# Patient Record
Sex: Male | Born: 1990 | Hispanic: Yes | Marital: Single | Smoking: Never smoker
Health system: Southern US, Community
[De-identification: ages and names within clinical notes are randomized; demographics above are authoritative.]

## PROBLEM LIST (undated history)

## (undated) DIAGNOSIS — T7840XA Allergy, unspecified, initial encounter: Secondary | ICD-10-CM

## (undated) DIAGNOSIS — R011 Cardiac murmur, unspecified: Secondary | ICD-10-CM

## (undated) DIAGNOSIS — J45909 Unspecified asthma, uncomplicated: Secondary | ICD-10-CM

## (undated) DIAGNOSIS — E162 Hypoglycemia, unspecified: Secondary | ICD-10-CM

## (undated) HISTORY — DX: Cardiac murmur, unspecified: R01.1

## (undated) HISTORY — DX: Allergy, unspecified, initial encounter: T78.40XA

## (undated) HISTORY — DX: Hypoglycemia, unspecified: E16.2

## (undated) HISTORY — DX: Unspecified asthma, uncomplicated: J45.909

---
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
## 2016-02-18 DIAGNOSIS — R480 Dyslexia and alexia: Secondary | ICD-10-CM | POA: Diagnosis not present

## 2016-02-18 DIAGNOSIS — Z79899 Other long term (current) drug therapy: Secondary | ICD-10-CM | POA: Diagnosis not present

## 2016-02-18 DIAGNOSIS — F1920 Other psychoactive substance dependence, uncomplicated: Secondary | ICD-10-CM | POA: Diagnosis not present

## 2016-02-18 DIAGNOSIS — H93299 Other abnormal auditory perceptions, unspecified ear: Secondary | ICD-10-CM | POA: Diagnosis not present

## 2016-02-18 DIAGNOSIS — F902 Attention-deficit hyperactivity disorder, combined type: Secondary | ICD-10-CM | POA: Diagnosis not present

## 2016-02-21 DIAGNOSIS — H5213 Myopia, bilateral: Secondary | ICD-10-CM | POA: Diagnosis not present

## 2016-04-16 DIAGNOSIS — R002 Palpitations: Secondary | ICD-10-CM | POA: Diagnosis not present

## 2016-04-16 DIAGNOSIS — T887XXA Unspecified adverse effect of drug or medicament, initial encounter: Secondary | ICD-10-CM | POA: Diagnosis not present

## 2016-04-22 DIAGNOSIS — Z113 Encounter for screening for infections with a predominantly sexual mode of transmission: Secondary | ICD-10-CM | POA: Diagnosis not present

## 2016-04-22 DIAGNOSIS — Z0000 Encounter for general adult medical examination without abnormal findings: Secondary | ICD-10-CM | POA: Diagnosis not present

## 2016-06-26 DIAGNOSIS — F909 Attention-deficit hyperactivity disorder, unspecified type: Secondary | ICD-10-CM | POA: Diagnosis not present

## 2016-06-26 DIAGNOSIS — Z23 Encounter for immunization: Secondary | ICD-10-CM | POA: Diagnosis not present

## 2016-10-01 DIAGNOSIS — F909 Attention-deficit hyperactivity disorder, unspecified type: Secondary | ICD-10-CM | POA: Diagnosis not present

## 2016-12-30 DIAGNOSIS — R0981 Nasal congestion: Secondary | ICD-10-CM | POA: Diagnosis not present

## 2016-12-30 DIAGNOSIS — F909 Attention-deficit hyperactivity disorder, unspecified type: Secondary | ICD-10-CM | POA: Diagnosis not present

## 2017-06-08 DIAGNOSIS — R21 Rash and other nonspecific skin eruption: Secondary | ICD-10-CM | POA: Diagnosis not present

## 2017-07-21 DIAGNOSIS — Z23 Encounter for immunization: Secondary | ICD-10-CM | POA: Diagnosis not present

## 2017-07-21 DIAGNOSIS — F909 Attention-deficit hyperactivity disorder, unspecified type: Secondary | ICD-10-CM | POA: Diagnosis not present

## 2017-08-10 DIAGNOSIS — Z566 Other physical and mental strain related to work: Secondary | ICD-10-CM | POA: Diagnosis not present

## 2017-08-10 DIAGNOSIS — R0602 Shortness of breath: Secondary | ICD-10-CM | POA: Diagnosis not present

## 2017-09-06 DIAGNOSIS — F419 Anxiety disorder, unspecified: Secondary | ICD-10-CM | POA: Diagnosis not present

## 2017-09-06 DIAGNOSIS — Z0000 Encounter for general adult medical examination without abnormal findings: Secondary | ICD-10-CM | POA: Diagnosis not present

## 2017-09-23 ENCOUNTER — Encounter (INDEPENDENT_AMBULATORY_CARE_PROVIDER_SITE_OTHER): Payer: Self-pay

## 2017-09-23 ENCOUNTER — Other Ambulatory Visit: Payer: Self-pay

## 2017-09-23 ENCOUNTER — Encounter: Payer: Self-pay | Admitting: Student in an Organized Health Care Education/Training Program

## 2017-09-23 ENCOUNTER — Ambulatory Visit
Payer: 59 | Attending: Student in an Organized Health Care Education/Training Program | Admitting: Student in an Organized Health Care Education/Training Program

## 2017-09-23 VITALS — BP 124/72 | HR 61 | Temp 98.1°F | Resp 18 | Ht 67.0 in | Wt 165.0 lb

## 2017-09-23 DIAGNOSIS — H539 Unspecified visual disturbance: Secondary | ICD-10-CM

## 2017-09-23 DIAGNOSIS — Z8679 Personal history of other diseases of the circulatory system: Secondary | ICD-10-CM | POA: Diagnosis not present

## 2017-09-23 DIAGNOSIS — Q211 Atrial septal defect: Secondary | ICD-10-CM | POA: Diagnosis not present

## 2017-09-23 DIAGNOSIS — R002 Palpitations: Secondary | ICD-10-CM

## 2017-09-23 DIAGNOSIS — R071 Chest pain on breathing: Secondary | ICD-10-CM

## 2017-09-23 DIAGNOSIS — J321 Chronic frontal sinusitis: Secondary | ICD-10-CM

## 2017-09-23 DIAGNOSIS — Q2112 Patent foramen ovale: Secondary | ICD-10-CM

## 2017-09-23 DIAGNOSIS — R079 Chest pain, unspecified: Secondary | ICD-10-CM | POA: Diagnosis not present

## 2017-09-23 DIAGNOSIS — R0602 Shortness of breath: Secondary | ICD-10-CM | POA: Diagnosis not present

## 2017-09-23 DIAGNOSIS — F908 Attention-deficit hyperactivity disorder, other type: Secondary | ICD-10-CM

## 2017-09-23 DIAGNOSIS — G8929 Other chronic pain: Secondary | ICD-10-CM | POA: Insufficient documentation

## 2017-09-23 DIAGNOSIS — F988 Other specified behavioral and emotional disorders with onset usually occurring in childhood and adolescence: Secondary | ICD-10-CM | POA: Insufficient documentation

## 2017-09-23 NOTE — Patient Instructions (Addendum)
1.  Schedule for echocardiogram to rule out PFO/atrial septal defect 2.  CT maxillofacial without contrast to evaluate for chronic frontal sinusitis 3.  Labs including myoglobin, CK-MB, troponin, BNP, d-dimer to evaluate symptoms of shortness of breath to better stratify DVT risk. 4.  Follow-up as needed.  Patient states that he will sign up for epic my chart to check his results there.  If any questions or concerns patient will contact clinic for follow-up.

## 2017-09-23 NOTE — Progress Notes (Signed)
Patient's Name: Martin Durham  MRN: [id number]  Referring Provider: No ref. provider found  DOB: 11-02-1990  PCP: System, Pcp Not In  DOS: 09/23/2017  Note by: Edward Jolly, MD  Service setting: Ambulatory outpatient  Specialty: Interventional Pain Management  Location: [facility] (AMB) Pain Management Facility  Visit type: Initial Patient Evaluation  Patient type: New Patient   Primary Reason(s) for Visit: Encounter for initial evaluation of one or more chronic problems (new to examiner) potentially causing chronic pain, and posing a threat to normal musculoskeletal function. (Level of risk: High) CC: Chest Pain  HPI  Martin Durham is a 26 y.o. year old, male patient, who comes today to see [place] for the first time for an initial evaluation of his chronic pain. He has ADD (attention deficit disorder); Chest pain on breathing; Shortness of breath; Hx of cardiac murmur; PFO (patent foramen ovale); Chronic frontal sinusitis; Palpitations; and Visual disturbance on their problem list. Today he comes in for evaluation of his Chest Pain  Pain Assessment: Location: Anterior Chest Radiating: denies Onset: More than a month ago Duration: Acute pain(5 weeks ago it started) Quality: Aching Severity: 4 /10 (self-reported pain score)  Effect on ADL:   Timing: Intermittent Modifying factors: time for it to subside  Onset and Duration: Present less than 3 months Cause of pain: Unknown Severity: No change since onset Timing: Not influenced by the time of the day and During activity or exercise Aggravating Factors: na Alleviating Factors: na Associated Problems: Constipation, Dizziness and Fatigue Quality of Pain: Feeling of constriction Previous Examinations or Tests: The patient denies tests Previous Treatments: The patient denies treatments  The patient comes into the clinics today for the first time for a chronic pain management evaluation.   Very pleasant 26 year old gentleman who presents with  chief complaint of dizziness, palpitations accompanied with shortness of breath and visual disturbance that occurred 5-6 weeks ago.  This lasted for approximately 30 minutes.  The patient was at work.  At that time, he had shortness of breath, palpitations, diaphoresis, blurry vision, confusion which subsided after about 30 minutes. the following day the patient went to an urgent care in [place].  At that time he had fingerstick blood glucose performed along with an EKG which was unremarkable and showed normal sinus rhythm.  He also had a CBC with differential which was unremarkable at that time.  The patient's complete metabolic panel was also within normal limits.  Since this episode, the patient states he has had intermittent episodes of shortness of breath but not to the same extent as before.  He states that he was playing video games 2 nights ago and that he also has shortness of breath and that when he is exercising his shortness of breath is out of proportion to his level of exertion. Patient states that during his first episode, he was in a very stressful job which he has since left.  He will be starting a new job in about 3 weeks.  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  Patient had another routine exam performed in high school prior to starting football and was also noted to have a murmur at that time but the patient was asymptomatic.  Patient's past medical history includes psoriasis and ADD.  Patient also endorses chronic nasal congestion and takes Flonase for his allergic rhinitis.  Patient denies any headaches, ringing in his ears, flashing lights, nosebleeds, swelling of lips or tongue, cough, night  sweats, weight loss, diarrhea, changes in bowel habits, abdominal pain, difficulty urinating, seizures, loss of consciousness.  Meds   Current Outpatient Medications:  .  aspirin EC 81 MG tablet, Take 81 mg by mouth daily., Disp: , Rfl:  .  fluticasone  (FLONASE) 50 MCG/ACT nasal spray, Place into both nostrils daily., Disp: , Rfl:     ROS  Cardiovascular History: Daily Aspirin intake Pulmonary or Respiratory History: Shortness of breath and Snoring  Neurological History: No reported neurological signs or symptoms such as seizures, abnormal skin sensations, urinary and/or fecal incontinence, being born with an abnormal open spine and/or a tethered spinal cord Review of Past Neurological Studies: No results found for this or any previous visit. Psychological-Psychiatric History: No reported psychological or psychiatric signs or symptoms such as difficulty sleeping, anxiety, depression, delusions or hallucinations (schizophrenial), mood swings (bipolar disorders) or suicidal ideations or attempts Gastrointestinal History: Reflux or heatburn Genitourinary History: No reported renal or genitourinary signs or symptoms such as difficulty voiding or producing urine, peeing blood, non-functioning kidney, kidney stones, difficulty emptying the bladder, difficulty controlling the flow of urine, or chronic kidney disease Hematological History: No reported hematological signs or symptoms such as prolonged bleeding, low or poor functioning platelets, bruising or bleeding easily, hereditary bleeding problems, low energy levels due to low hemoglobin or being anemic Endocrine History: No reported endocrine signs or symptoms such as high or low blood sugar, rapid heart rate due to high thyroid levels, obesity or weight gain due to slow thyroid or thyroid disease Rheumatologic History: No reported rheumatological signs and symptoms such as fatigue, joint pain, tenderness, swelling, redness, heat, stiffness, decreased range of motion, with or without associated rash Musculoskeletal History: Negative for myasthenia gravis, muscular dystrophy, multiple sclerosis or malignant hyperthermia Work History: Working full time  Allergies  Martin Durham has No Known  Allergies.   Note: Lab results reviewed.  PFSH  Drug: Martin Durham  has no drug history on file. Alcohol:  has no alcohol history on file. Tobacco:  reports that  has never smoked. he has never used smokeless tobacco. Medical:  has a past medical history of Heart murmur. Family: family history includes Hyperlipidemia in his paternal grandfather and sister; Hypertension in his paternal grandfather and sister.  Past Surgical History:  Procedure Laterality Date  . WISDOM TOOTH EXTRACTION Bilateral 2012   Active Ambulatory Problems    Diagnosis Date Noted  . ADD (attention deficit disorder) 09/23/2017  . Chest pain on breathing 09/23/2017  . Shortness of breath 09/23/2017  . Hx of cardiac murmur 09/23/2017  . PFO (patent foramen ovale) 09/23/2017  . Chronic frontal sinusitis 09/23/2017  . Palpitations 09/23/2017  . Visual disturbance 09/23/2017   Resolved Ambulatory Problems    Diagnosis Date Noted  . No Resolved Ambulatory Problems   Past Medical History:  Diagnosis Date  . Heart murmur    Constitutional Exam  General appearance: Well nourished, well developed, and well hydrated. In no apparent acute distress Vitals:   09/23/17 1332  BP: 124/72  Pulse: 61  Resp: 18  Temp: 98.1 F (36.7 C)  TempSrc: Oral  SpO2: 100%  Weight: 165 lb (74.8 kg)  Height: 5\' 7"  (1.702 m)   BMI Assessment: Estimated body mass index is 25.84 kg/m as calculated from the following:   Height as of this encounter: 5\' 7"  (1.702 m).   Weight as of this encounter: 165 lb (74.8 kg).  BMI interpretation table: BMI level Category Range association with higher incidence of chronic  pain  <18 kg/m2 Underweight   18.5-24.9 kg/m2 Ideal body weight   25-29.9 kg/m2 Overweight Increased incidence by 20%  30-34.9 kg/m2 Obese (Class I) Increased incidence by 68%  35-39.9 kg/m2 Severe obesity (Class II) Increased incidence by 136%  >40 kg/m2 Extreme obesity (Class III) Increased incidence by 254%   BMI  Readings from Last 4 Encounters:  09/23/17 25.84 kg/m   Wt Readings from Last 4 Encounters:  09/23/17 165 lb (74.8 kg)  Psych/Mental status: Alert, oriented x 3 (person, place, & time)       Eyes: PERLA Respiratory: No evidence of acute respiratory distress Cardiac: No audible murmurs rubs or gallops appreciated.  No carotid bruits appreciated bilaterally.  Regular rate and rhythm.   Cervical Spine Area Exam  Skin & Axial Inspection: No masses, redness, edema, swelling, or associated skin lesions Alignment: Symmetrical Functional ROM: Unrestricted ROM      Stability: No instability detected Muscle Tone/Strength: Functionally intact. No obvious neuro-muscular anomalies detected. Sensory (Neurological): Unimpaired Palpation: No palpable anomalies              Upper Extremity (UE) Exam    Side: Right upper extremity  Side: Left upper extremity  Skin & Extremity Inspection: Skin color, temperature, and hair growth are WNL. No peripheral edema or cyanosis. No masses, redness, swelling, asymmetry, or associated skin lesions. No contractures.  Skin & Extremity Inspection: Skin color, temperature, and hair growth are WNL. No peripheral edema or cyanosis. No masses, redness, swelling, asymmetry, or associated skin lesions. No contractures.  Functional ROM: Unrestricted ROM          Functional ROM: Unrestricted ROM          Muscle Tone/Strength: Functionally intact. No obvious neuro-muscular anomalies detected.  Muscle Tone/Strength: Functionally intact. No obvious neuro-muscular anomalies detected.  Sensory (Neurological): Unimpaired          Sensory (Neurological): Unimpaired          Palpation: No palpable anomalies              Palpation: No palpable anomalies              Specialized Test(s): Deferred         Specialized Test(s): Deferred          Thoracic Spine Area Exam  Skin & Axial Inspection: No masses, redness, or swelling Alignment: Symmetrical Functional ROM: Unrestricted  ROM Stability: No instability detected Muscle Tone/Strength: Functionally intact. No obvious neuro-muscular anomalies detected. Sensory (Neurological): Unimpaired Muscle strength & Tone: No palpable anomalies  Lumbar Spine Area Exam  Skin & Axial Inspection: No masses, redness, or swelling Alignment: Symmetrical Functional ROM: Unrestricted ROM      Stability: No instability detected Muscle Tone/Strength: Functionally intact. No obvious neuro-muscular anomalies detected. Sensory (Neurological): Unimpaired Palpation: No palpable anomalies       Provocative Tests: Lumbar Hyperextension and rotation test: evaluation deferred today       Lumbar Lateral bending test: evaluation deferred today       Patrick's Maneuver: evaluation deferred today                    Gait & Posture Assessment  Ambulation: Unassisted Gait: Relatively normal for age and body habitus Posture: WNL   Lower Extremity Exam    Side: Right lower extremity  Side: Left lower extremity  Skin & Extremity Inspection: Skin color, temperature, and hair growth are WNL. No peripheral edema or cyanosis. No masses, redness, swelling, asymmetry, or associated skin  lesions. No contractures.  Skin & Extremity Inspection: Skin color, temperature, and hair growth are WNL. No peripheral edema or cyanosis. No masses, redness, swelling, asymmetry, or associated skin lesions. No contractures.  Functional ROM: Unrestricted ROM          Functional ROM: Unrestricted ROM          Muscle Tone/Strength: Functionally intact. No obvious neuro-muscular anomalies detected.  Muscle Tone/Strength: Functionally intact. No obvious neuro-muscular anomalies detected.  Sensory (Neurological): Unimpaired  Sensory (Neurological): Unimpaired  Palpation: No palpable anomalies  Palpation: No palpable anomalies   Assessment  Primary Diagnosis & Pertinent Problem List: The primary encounter diagnosis was Chest pain on breathing. Diagnoses of Shortness of  breath, Hx of cardiac murmur, PFO (patent foramen ovale), Chronic frontal sinusitis, Palpitations, Visual disturbance, and Attention deficit hyperactivity disorder (ADHD), other type were also pertinent to this visit.  Visit Diagnosis (New problems to examiner): 1. Chest pain on breathing   2. Shortness of breath   3. Hx of cardiac murmur   4. PFO (patent foramen ovale)   5. Chronic frontal sinusitis   6. Palpitations   7. Visual disturbance   8. Attention deficit hyperactivity disorder (ADHD), other type     26 year old male who presents with episodic shortness of breath, palpitations accompanied with diaphoresis, confusion, and vision changes which started approximately 5-6 weeks ago.  This was during a job stressor and the patient has quit that job and will be starting a new one approximately 3 weeks.  He still endorses shortness of breath and palpitations but not to the same extent as before.  Given that the patient had a PFO as a child with an audible murmur documented by physicians in the past but not currently, it be reasonable to obtain an echocardiogram to rule out PFO or atrial septal defect could be contributing to his chest pain, shortness of breath, palpitations.  The patient also has symptoms of chronic sinusitis and we will obtain a max of facial CT to evaluate his frontal sinuses.  Furthermore for risk stratification, will obtain myoglobin, CK-MB, troponin I, BNP and d-dimer to evaluate his risk of DVT given his symptoms of dyspnea and shortness of breath.  Patient states that he will check his results on my chart.  There is anything concerning, I will schedule the patient for follow-up.  At this point I would like to get the above mentioned studies and labs to rule out any life-threatening pathology.  In my opinion, most likely this is due to anxiety and panic attack secondary to job stressors but it is odd that the patient is continuing to have intermittent episodes of shortness of  breath and palpitations even when he is not at work and since he will be starting a new job.  Plan: -Echocardiogram to evaluate PFO/ASD -Maxofacial CT to evaluate sinusitis  -Myoglobin, CK-MB, troponin, BNP, d-dimer to evaluate DVT risk  Ordered Lab-work, Procedure(s), Referral(s), & Consult(s): Orders Placed This Encounter  Procedures  . CT MAXILLOFACIAL WO CONTRAST  . Myoglobin, serum  . CKMB([facility] only)  . Troponin I  . Brain natriuretic peptide  . D-dimer, quantitative  . ECHOCARDIOGRAM COMPLETE    Provider-requested follow-up: Return if symptoms worsen or fail to improve.  Future Appointments  Date Time Provider Department Center  10/01/2017  7:00 AM [facility]    Primary Care Physician: System, Pcp Not In Location: [facility] Note by: Edward Jolly, M.D, Date: 09/23/2017; Time: 3:03 PM  Patient Instructions  1.  Schedule for echocardiogram to rule out PFO/atrial septal defect 2.  CT maxillofacial without contrast to evaluate for chronic frontal sinusitis 3.  Labs including myoglobin, CK-MB, troponin, BNP, d-dimer to evaluate symptoms of shortness of breath to better stratify DVT risk. 4.  Follow-up as needed.  Patient states that he will sign up for epic my chart to check his results there.  If any questions or concerns patient will contact clinic for follow-up.

## 2017-09-23 NOTE — Progress Notes (Signed)
Safety precautions to be maintained throughout the outpatient stay will include: orient to surroundings, keep bed in low position, maintain call bell within reach at all times, provide assistance with transfer out of bed and ambulation.  

## 2017-09-24 LAB — D-DIMER, QUANTITATIVE (NOT AT ARMC)

## 2017-09-24 LAB — MYOGLOBIN, SERUM: MYOGLOBIN: 42 ng/mL (ref 28–72)

## 2017-10-01 ENCOUNTER — Ambulatory Visit
Admission: RE | Admit: 2017-10-01 | Discharge: 2017-10-01 | Disposition: A | Discharge status: Home / Self Care | Payer: 59 | Source: Ambulatory Visit | Attending: Student in an Organized Health Care Education/Training Program | Admitting: Student in an Organized Health Care Education/Training Program

## 2017-10-01 DIAGNOSIS — R011 Cardiac murmur, unspecified: Secondary | ICD-10-CM | POA: Insufficient documentation

## 2017-10-01 DIAGNOSIS — J321 Chronic frontal sinusitis: Secondary | ICD-10-CM | POA: Diagnosis not present

## 2017-10-01 DIAGNOSIS — Q211 Atrial septal defect: Secondary | ICD-10-CM | POA: Insufficient documentation

## 2017-10-01 DIAGNOSIS — J329 Chronic sinusitis, unspecified: Secondary | ICD-10-CM | POA: Diagnosis not present

## 2017-10-01 DIAGNOSIS — Q2112 Patent foramen ovale: Secondary | ICD-10-CM

## 2017-10-01 NOTE — Progress Notes (Signed)
*  PRELIMINARY RESULTS* Echocardiogram 2D Echocardiogram has been performed.  Cristela BlueHege, Shylah Dossantos 10/01/2017, 10:29 AM

## 2017-11-30 DIAGNOSIS — J069 Acute upper respiratory infection, unspecified: Secondary | ICD-10-CM | POA: Diagnosis not present

## 2017-11-30 DIAGNOSIS — J309 Allergic rhinitis, unspecified: Secondary | ICD-10-CM | POA: Insufficient documentation

## 2017-12-09 DIAGNOSIS — M950 Acquired deformity of nose: Secondary | ICD-10-CM | POA: Diagnosis not present

## 2017-12-09 DIAGNOSIS — J342 Deviated nasal septum: Secondary | ICD-10-CM | POA: Diagnosis not present

## 2017-12-09 DIAGNOSIS — J343 Hypertrophy of nasal turbinates: Secondary | ICD-10-CM | POA: Diagnosis not present

## 2018-12-18 IMAGING — CT CT MAXILLOFACIAL W/O CM
3 series · 15 of 47 positions shown, 18 images · non-contrast
Comparison: None

CLINICAL DATA: Continued sinus pressure and pain, no relief with
over the counter medication

EXAM:
CT MAXILLOFACIAL WITHOUT CONTRAST
TECHNIQUE: Multidetector CT imaging of the maxillofacial structures was
performed. Multiplanar CT image reconstructions were also generated.
Right side of face marked with BB.

[Series 3: ax soft · axial · 0.39mm/px · z∈[-140,-16]mm · 9 of 74 slices shown, 12 images]
[im 6/74  brain]
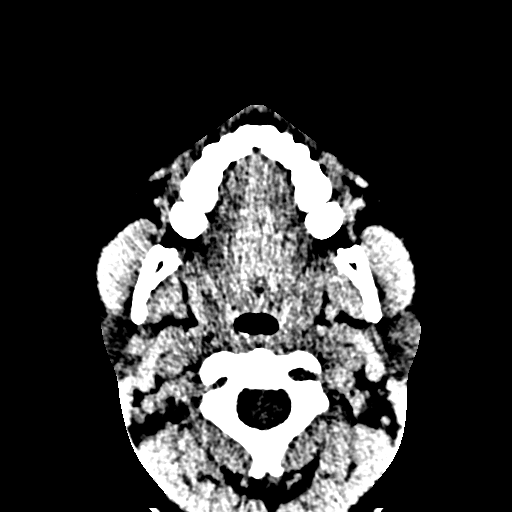
[im 6/74  bone]
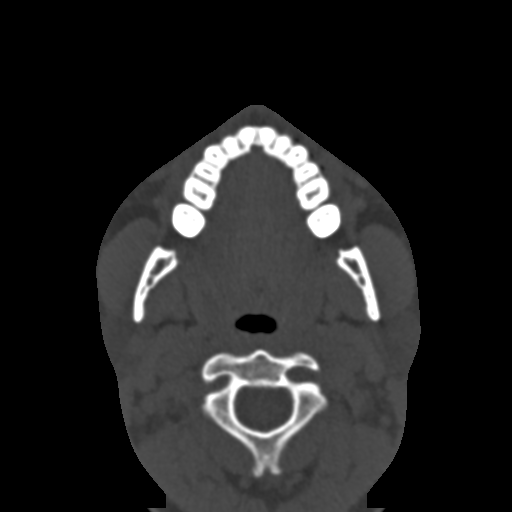
[im 13/74  bone]
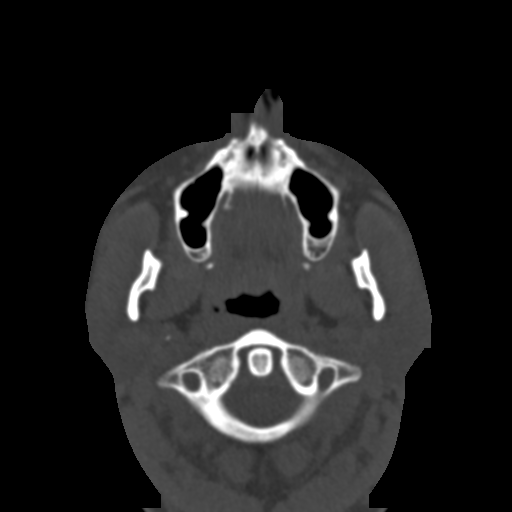
[im 21/74  bone]
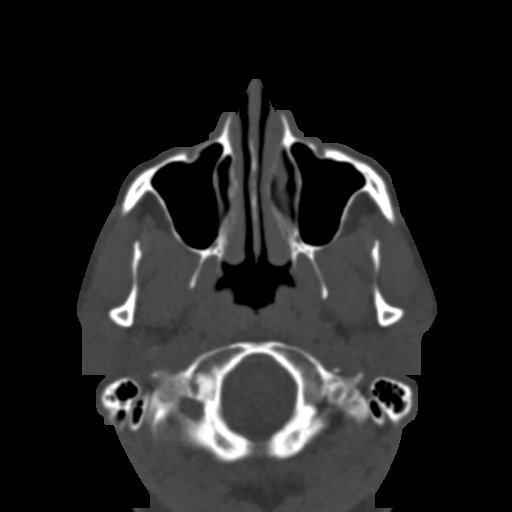
[im 28/74  bone]
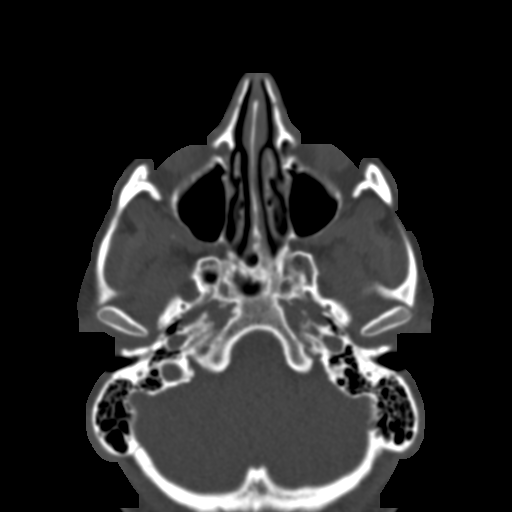
[im 38/74  brain]
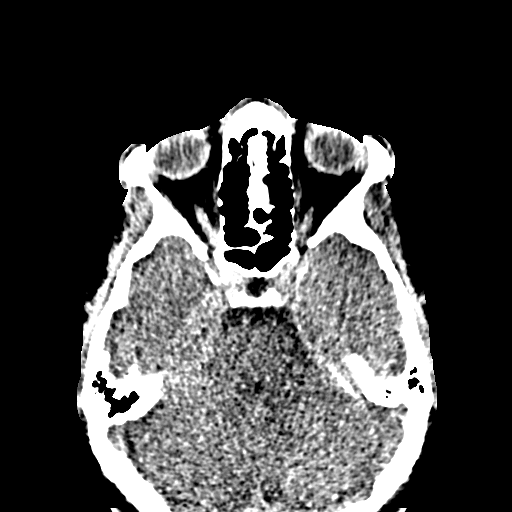
[im 38/74  bone]
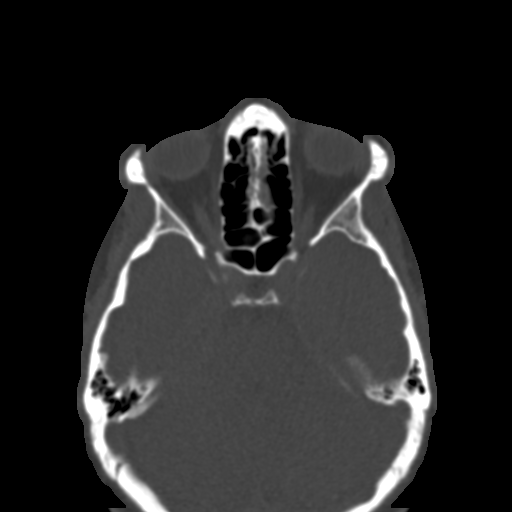
[im 46/74  bone]
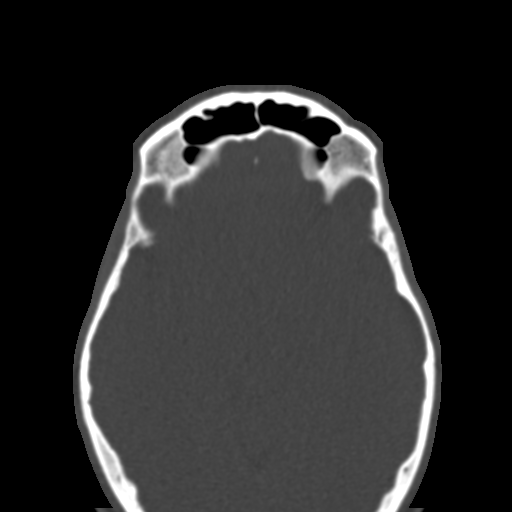
[im 53/74  bone]
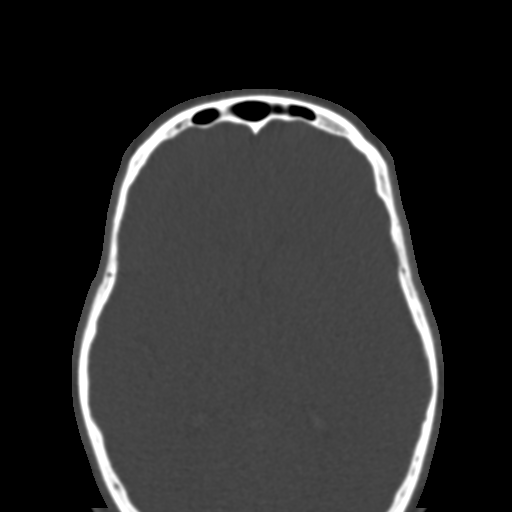
[im 61/74  bone]
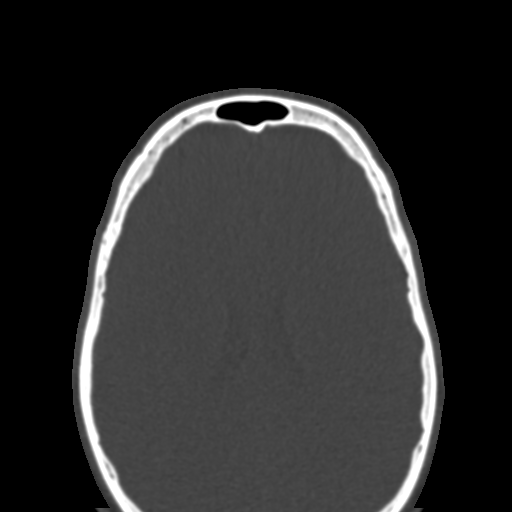
[im 68/74  brain]
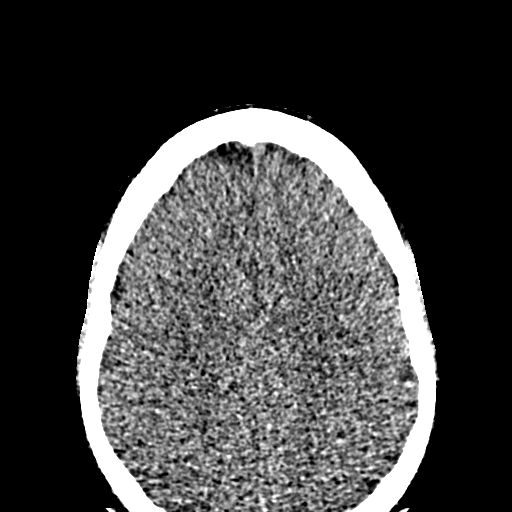
[im 68/74  bone]
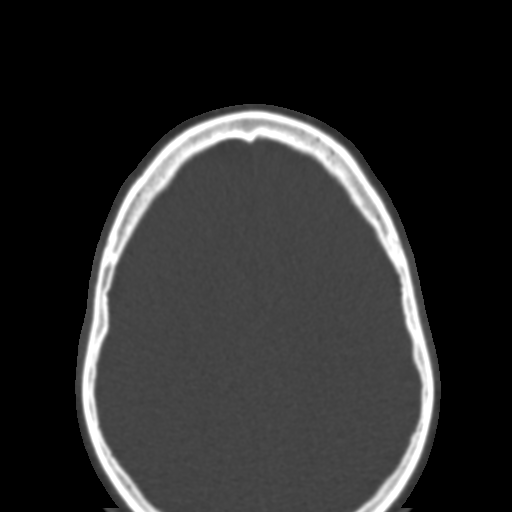

[Series 4: coronal bone · coronal · 0.32mm/px · 3 of 92 slices shown]
[im 31/92  bone]
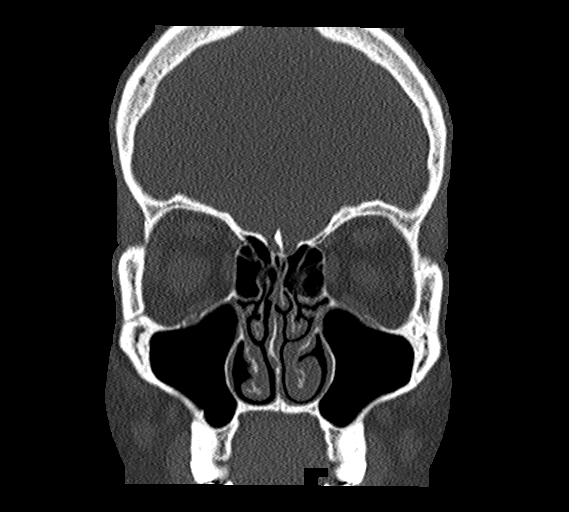
[im 41/92  bone]
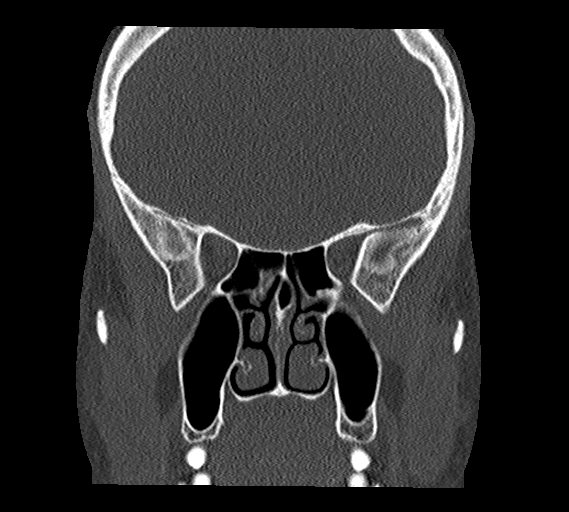
[im 51/92  bone]
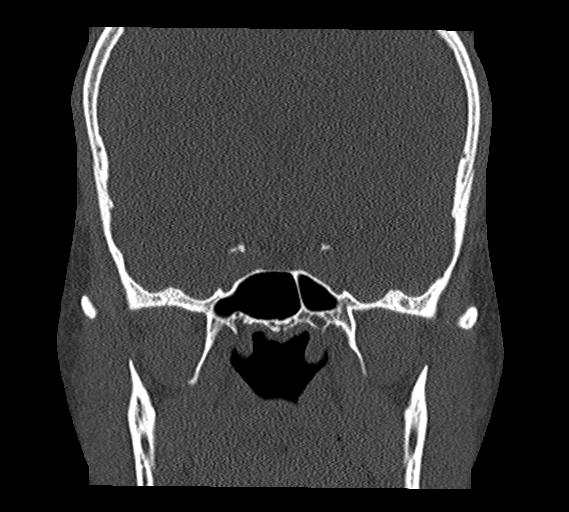

[Series 5: sagittal bone · sagittal · 0.33mm/px · 3 of 78 slices shown]
[im 26/78  bone]
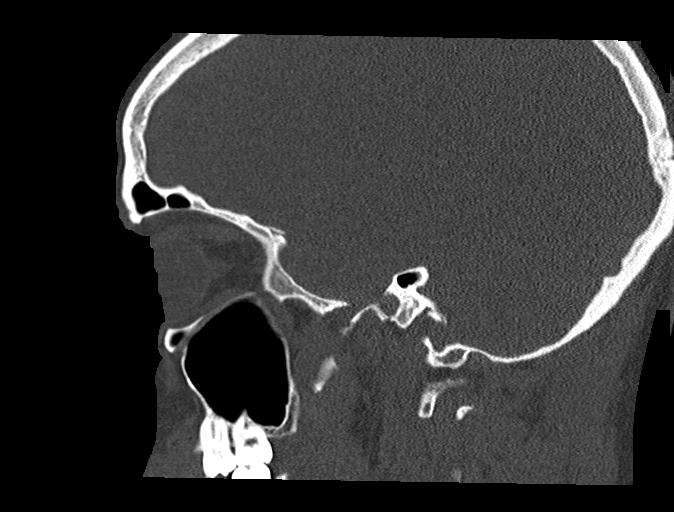
[im 39/78  bone]
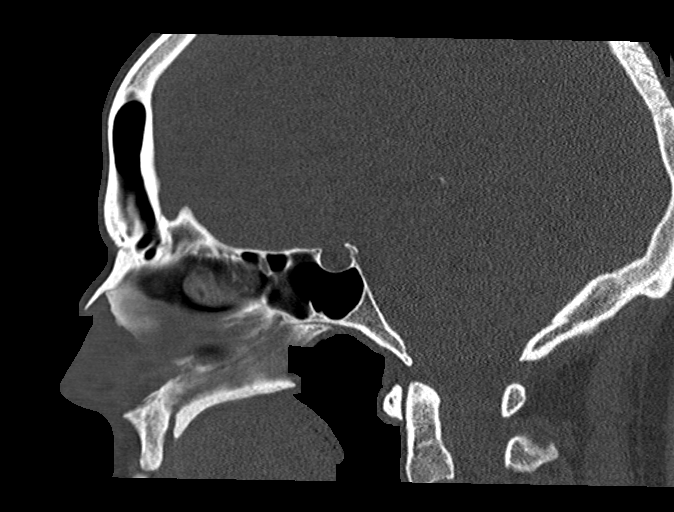
[im 52/78  bone]
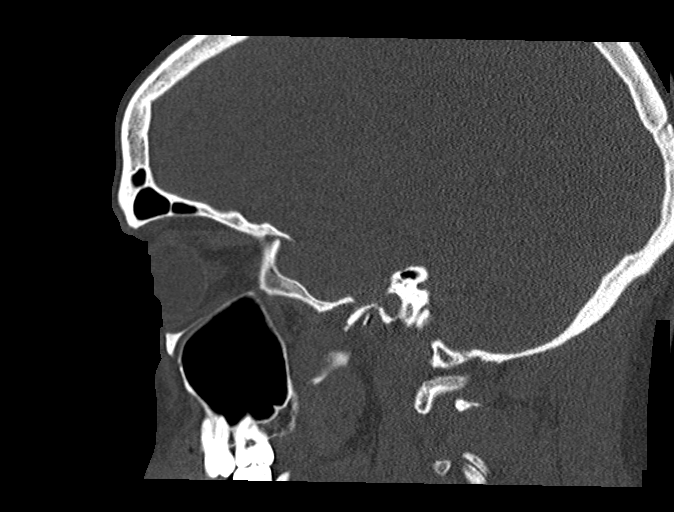

[15 of 47 positions shown; findings below may reference images not displayed]

FINDINGS: Osseous: TMJ alignment normal bilaterally. Visualize calvaria
intact. Facial bones intact. No fracture or bone destruction.

Orbits: Bony orbits intact.  Intraorbital soft tissue planes clear.

Sinuses: Paranasal sinuses, mastoid air cells, and middle ear
cavities clear.

Soft tissues: Facial soft tissues unremarkable

Limited intracranial: Normal appearance
IMPRESSION: Normal exam.

## 2020-06-09 ENCOUNTER — Other Ambulatory Visit: Payer: Self-pay | Admitting: Pain Medicine

## 2020-06-09 DIAGNOSIS — J45909 Unspecified asthma, uncomplicated: Secondary | ICD-10-CM | POA: Insufficient documentation

## 2020-06-09 DIAGNOSIS — J4540 Moderate persistent asthma, uncomplicated: Secondary | ICD-10-CM

## 2020-06-09 DIAGNOSIS — J45990 Exercise induced bronchospasm: Secondary | ICD-10-CM | POA: Insufficient documentation

## 2020-06-09 MED ORDER — ALBUTEROL SULFATE HFA 108 (90 BASE) MCG/ACT IN AERS: 1.0000 | INHALATION_SPRAY | RESPIRATORY_TRACT | 99 refills | Status: DC | PRN

## 2020-09-26 ENCOUNTER — Ambulatory Visit: Payer: 59 | Admitting: Family Medicine

## 2020-10-10 ENCOUNTER — Other Ambulatory Visit: Payer: Self-pay | Admitting: Pain Medicine

## 2020-10-10 DIAGNOSIS — Q674 Other congenital deformities of skull, face and jaw: Secondary | ICD-10-CM

## 2020-10-10 DIAGNOSIS — R002 Palpitations: Secondary | ICD-10-CM

## 2020-10-10 DIAGNOSIS — Z8709 Personal history of other diseases of the respiratory system: Secondary | ICD-10-CM

## 2020-10-10 DIAGNOSIS — R0602 Shortness of breath: Secondary | ICD-10-CM

## 2020-10-10 DIAGNOSIS — R42 Dizziness and giddiness: Secondary | ICD-10-CM | POA: Insufficient documentation

## 2020-10-10 DIAGNOSIS — Q2112 Patent foramen ovale: Secondary | ICD-10-CM

## 2020-10-10 DIAGNOSIS — Q211 Atrial septal defect: Secondary | ICD-10-CM

## 2020-10-10 DIAGNOSIS — R000 Tachycardia, unspecified: Secondary | ICD-10-CM

## 2020-10-10 DIAGNOSIS — Z8679 Personal history of other diseases of the circulatory system: Secondary | ICD-10-CM

## 2020-10-10 NOTE — Progress Notes (Signed)
30 year old Hispanic male born with a patent foramen ovale, currently experiencing intermittent episodes of shortness of breath, tachycardia, hypertension, and lightheadedness.  The patient has been referred to cardiology for evaluation and treatment.  In addition the patient has a history of a deviated septum with occasional difficult nasal breathing, currently being also referred to ENT for evaluation and treatment.

## 2020-10-18 ENCOUNTER — Other Ambulatory Visit: Payer: Self-pay | Admitting: Pain Medicine

## 2020-10-18 DIAGNOSIS — J309 Allergic rhinitis, unspecified: Secondary | ICD-10-CM | POA: Insufficient documentation

## 2020-10-18 DIAGNOSIS — L259 Unspecified contact dermatitis, unspecified cause: Secondary | ICD-10-CM | POA: Insufficient documentation

## 2020-10-18 DIAGNOSIS — J3089 Other allergic rhinitis: Secondary | ICD-10-CM | POA: Insufficient documentation

## 2020-10-18 DIAGNOSIS — L309 Dermatitis, unspecified: Secondary | ICD-10-CM | POA: Insufficient documentation

## 2020-10-18 DIAGNOSIS — F419 Anxiety disorder, unspecified: Secondary | ICD-10-CM | POA: Insufficient documentation

## 2020-10-18 DIAGNOSIS — Z872 Personal history of diseases of the skin and subcutaneous tissue: Secondary | ICD-10-CM | POA: Insufficient documentation

## 2020-10-18 MED ORDER — AZELASTINE HCL 137 MCG/SPRAY NA SOLN: 1.0000 | Freq: Every day | NASAL | 99 refills | Status: DC

## 2020-10-22 ENCOUNTER — Encounter (INDEPENDENT_AMBULATORY_CARE_PROVIDER_SITE_OTHER): Payer: Self-pay | Admitting: Otolaryngology

## 2020-10-22 ENCOUNTER — Other Ambulatory Visit: Payer: Self-pay | Admitting: Pain Medicine

## 2020-10-22 ENCOUNTER — Other Ambulatory Visit: Payer: Self-pay

## 2020-10-22 ENCOUNTER — Ambulatory Visit (INDEPENDENT_AMBULATORY_CARE_PROVIDER_SITE_OTHER): Payer: 59 | Admitting: Otolaryngology

## 2020-10-22 VITALS — Temp 97.3°F

## 2020-10-22 DIAGNOSIS — E162 Hypoglycemia, unspecified: Secondary | ICD-10-CM | POA: Insufficient documentation

## 2020-10-22 DIAGNOSIS — J342 Deviated nasal septum: Secondary | ICD-10-CM

## 2020-10-22 DIAGNOSIS — R531 Weakness: Secondary | ICD-10-CM | POA: Insufficient documentation

## 2020-10-22 DIAGNOSIS — J310 Chronic rhinitis: Secondary | ICD-10-CM | POA: Diagnosis not present

## 2020-10-22 DIAGNOSIS — I471 Supraventricular tachycardia: Secondary | ICD-10-CM | POA: Insufficient documentation

## 2020-10-22 DIAGNOSIS — L509 Urticaria, unspecified: Secondary | ICD-10-CM | POA: Insufficient documentation

## 2020-10-22 DIAGNOSIS — H538 Other visual disturbances: Secondary | ICD-10-CM | POA: Insufficient documentation

## 2020-10-22 DIAGNOSIS — R0689 Other abnormalities of breathing: Secondary | ICD-10-CM | POA: Insufficient documentation

## 2020-10-22 DIAGNOSIS — R410 Disorientation, unspecified: Secondary | ICD-10-CM | POA: Insufficient documentation

## 2020-10-22 DIAGNOSIS — R062 Wheezing: Secondary | ICD-10-CM | POA: Insufficient documentation

## 2020-10-22 DIAGNOSIS — R42 Dizziness and giddiness: Secondary | ICD-10-CM | POA: Insufficient documentation

## 2020-10-22 DIAGNOSIS — R519 Headache, unspecified: Secondary | ICD-10-CM | POA: Insufficient documentation

## 2020-10-22 DIAGNOSIS — R4189 Other symptoms and signs involving cognitive functions and awareness: Secondary | ICD-10-CM | POA: Insufficient documentation

## 2020-10-22 DIAGNOSIS — R21 Rash and other nonspecific skin eruption: Secondary | ICD-10-CM | POA: Insufficient documentation

## 2020-10-22 NOTE — Progress Notes (Signed)
HPI: Martin Durham is a 30 y.o. male who presents for evaluation of nasal obstruction.  Over the past couple years he notices that he does not breathe well through both nostrils.  The left side is generally worse but the obstruction alternates from side to side.  He has used Flonase in the past but this did not seem to help much.  More recently was prescribed azelastine by an allergist which seemed to help a little bit.  He is actually breathing better today. He does have history of allergies and has used Singulair and Claritin in the past.  Past Medical History:  Diagnosis Date  . Heart murmur    Past Surgical History:  Procedure Laterality Date  . WISDOM TOOTH EXTRACTION Bilateral 2012   Social History   Socioeconomic History  . Marital status: Single    Spouse name: Not on file  . Number of children: Not on file  . Years of education: Not on file  . Highest education level: Not on file  Occupational History  . Not on file  Tobacco Use  . Smoking status: Never Smoker  . Smokeless tobacco: Never Used  Substance and Sexual Activity  . Alcohol use: Not on file  . Drug use: Not on file  . Sexual activity: Not on file  Other Topics Concern  . Not on file  Social History Narrative  . Not on file   Social Determinants of Health   Financial Resource Strain: Not on file  Food Insecurity: Not on file  Transportation Needs: Not on file  Physical Activity: Not on file  Stress: Not on file  Social Connections: Not on file   Family History  Problem Relation Age of Onset  . Hypertension Sister   . Hyperlipidemia Sister   . Hyperlipidemia Paternal Grandfather   . Hypertension Paternal Grandfather    Allergies  Allergen Reactions  . Dust Mite Extract Other (See Comments)    Rhinitis   Prior to Admission medications   Medication Sig Start Date End Date Taking? Authorizing Provider  albuterol (VENTOLIN HFA) 108 (90 Base) MCG/ACT inhaler Inhale 1-2 puffs into the lungs  every 4 (four) hours as needed for wheezing or shortness of breath. 06/09/20 12/06/20  Delano Metz, MD  albuterol (VENTOLIN HFA) 108 (90 Base) MCG/ACT inhaler Inhale 2 puffs into the lungs every 6 (six) hours as needed for wheezing.    [provider]  aspirin EC 81 MG tablet Take 81 mg by mouth daily.    [provider]  Azelastine HCl 137 MCG/SPRAY SOLN Place 1 spray into the nose daily. 10/18/20   Delano Metz, MD  fluticasone (FLONASE) 50 MCG/ACT nasal spray Place 1 spray into the nose daily.    [provider]  fluticasone (FLONASE) 50 MCG/ACT nasal spray Place 1 spray into the nose daily.    [provider]  fluticasone furoate-vilanterol (BREO ELLIPTA) 200-25 MCG/INH AEPB Take 1 puff by mouth daily.    [provider]  loratadine (CLARITIN) 10 MG tablet Take 10 mg by mouth daily.    [provider]  montelukast (SINGULAIR) 10 MG tablet Take 1 tablet by mouth daily.    [provider]  oseltamivir (TAMIFLU) 75 MG capsule Take 1 capsule by mouth 2 (two) times daily. X 5 days    [provider]     Positive ROS: Otherwise negative  All other systems have been reviewed and were otherwise negative with the exception of those mentioned in the  HPI and as above.  Physical Exam: Constitutional: Alert, well-appearing, no acute distress Ears: External ears without lesions or tenderness. Ear canals are clear bilaterally with intact, clear TMs.  Nasal: External nose without lesions. Septum is deviated to the left but not obstructing the nasal cavity.  He has no significant swelling of the inferior turbinates presently.  After decongesting the nose there were no polyps or intranasal masses noted.  Both middle meatus regions were clear..  Oral: Lips and gums without lesions. Tongue and palate mucosa without lesions. Posterior oropharynx clear. Neck: No palpable adenopathy or masses Respiratory: Breathing comfortably   Skin: No facial/neck lesions or rash noted.  Procedures  Assessment: Allergic rhinitis. 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  Plan: Discussed with the patient concerning use of nasal steroid spray either Flonase or Nasacort 2 sprays each nostril at night.  He can also use the azelastine in addition to the nasal steroid spray. If he continues to have chronic problems with nasal obstruction could consider surgical intervention and briefly discussed with him concerning septoplasty and turbinate reductions which would help in large both nasal cavities.  Narda Bonds, MD

## 2020-10-23 NOTE — Progress Notes (Signed)
New Outpatient Visit Date: 10/24/2020  Primary Care Provider: None  Chief Complaint: Papitations and lightheadedness  HPI:  Mr. Martin Durham is a 30 y.o. male who is being seen today for the evaluation of palpitations. He has a history of heart murmur as a child, asthma, and environmental allergies.  For the last several years, he has experienced intermittent palpitations.  However, the symptoms have progressed over the last year for unclear reasons.  At times, Mr. Martin Durham experiences skipped beats.  However, at other times it feels like his heart races for no reason.  Episodes can last from a few seconds to 30 minutes.  Accompanying symptoms include shortness of breath, lightheadedness, and tingling in his extremities.  Episodes are often precipitated by exercise, eating, and feeling hungry.  Mr. Martin Durham has never passed out.  Ehocardiogram was performed in 2018 for evaluation of heart murmur and possible PFO; no significant abnormality was identified though bubble study was not performed as part of the test.  Mr. Martin Durham reports elevated heart rates in the past, which prompted him to stop taking Vyvanse ~3 years ago.  He also cut out caffeine consumption at that time.  He is concerned that low blood sugars may be contributing to his symptoms, as he has been checking them when he is symptomatic and notes readings as low as the 50's (he does not have a history of diabetes nor does he take glucose-lowering agents).  Following our encounter, shortly after having had labs drawn and being prepped for event monitor placement, Mr. Martin Durham suddenly became lightheaded and diaphoretic.  He also felt like his heart was beating faster/harder than normal.  He was assisted to the ground with resolution of his symptoms over the course of several minutes.  HF and blood pressure remained normal.  EKG was stable.Marland Kitchen  He did not pass out.  He had never had an episode like  this.  --------------------------------------------------------------------------------------------------  Cardiovascular History & Procedures: Cardiovascular Problems:  Palpitations  Near-syncope  Risk Factors:  Male gender  Cath/PCI:  None  CV Surgery:  None  EP Procedures and Devices:  None  Non-Invasive Evaluation(s):  TEE (10/01/2017): Normal LV size and wall thickness.  LVEF 55-60% with normal diastolic function.  Normal RV size and function.  No significant valvular abnormality.  No definite evidence of PFO by color Doppler.  Bubble study was not performed.  --------------------------------------------------------------------------------------------------  Past Medical History:  Diagnosis Date   Allergies    Asthma    Heart murmur     Past Surgical History:  Procedure Laterality Date   WISDOM TOOTH EXTRACTION Bilateral 2012    Current Meds  Medication Sig   albuterol (VENTOLIN HFA) 108 (90 Base) MCG/ACT inhaler Inhale 1-2 puffs into the lungs every 4 (four) hours as needed for wheezing or shortness of breath.   Azelastine HCl 137 MCG/SPRAY SOLN Place 1 spray into the nose daily.   fluticasone (FLONASE) 50 MCG/ACT nasal spray Place 1 spray into the nose daily.   [DISCONTINUED] albuterol (VENTOLIN HFA) 108 (90 Base) MCG/ACT inhaler Inhale 2 puffs into the lungs every 6 (six) hours as needed for wheezing.    Allergies: Dust mite extract  Social History   Tobacco Use   Smoking status: Never Smoker   Smokeless tobacco: Never Used  Substance Use Topics   Alcohol use: Not Currently   Drug use: Not Currently    Types: Marijuana    Comment: Last used 1-2 months ago    Family History  Problem Relation  Age of Onset   Diabetes Father    Hyperlipidemia Paternal Grandfather    Hypertension Paternal Grandfather    Heart disease Neg Hx     Review of Systems: A 12-system review of systems was performed and was negative except as noted  in the HPI.  --------------------------------------------------------------------------------------------------  Physical Exam: BP 120/88 (BP Location: Right Arm, Patient Position: Sitting, Cuff Size: Normal)    Pulse 86    Ht 5\' 8"  (1.727 m)    Wt 212 lb (96.2 kg)    SpO2 98%    BMI 32.23 kg/m   General:  NAD. HEENT: No conjunctival pallor or scleral icterus. Facemask in place. Neck: Supple without lymphadenopathy, thyromegaly, JVD, or HJR. No carotid bruit. Lungs: Normal work of breathing. Clear to auscultation bilaterally without wheezes or crackles. Heart: Regular rate and rhythm without murmurs, rubs, or gallops. Non-displaced PMI. Abd: Bowel sounds present. Soft, NT/ND without hepatosplenomegaly Ext: No lower extremity edema. Radial, PT, and DP pulses are 2+ bilaterally Skin: Warm and dry without rash. Neuro: CNIII-XII intact. Strength and fine-touch sensation intact in upper and lower extremities bilaterally. Psych: Normal mood and affect.  EKG:  Normal sinus rhythm with borderline LVH.  --------------------------------------------------------------------------------------------------  ASSESSMENT AND PLAN: Near-syncope, shortness of breath, and palpitations: Mr. reports palpitations for years, though his constellation of symptoms has worsened over the last year.  Exercise-induced component with associated lightheadedness is somewhat concerning.  Echo in 2018 was unremarkable.  EKG's today show borderline LVH but otherwise no significant abnormalities.  Near-syncopal episode today was most likely vasovagal in nature.  We have agreed to check a CBC, CMP, TSH, and hemoglobin A1c (given report of intermittent hypoglycemia).  We will also place a 14-day event monitor.  Based on results, we will need to consider exercise tolerance test to exclude exercise-induced arrhythmia.  We will defer repeating echo at this time, given unremarkable study in 2018.  Follow-up: Return to clinic  in 4-6 weeks.  2019, MD 10/24/2020 10:00 PM

## 2020-10-24 ENCOUNTER — Encounter: Payer: Self-pay | Admitting: Internal Medicine

## 2020-10-24 ENCOUNTER — Ambulatory Visit (INDEPENDENT_AMBULATORY_CARE_PROVIDER_SITE_OTHER): Payer: 59 | Admitting: Internal Medicine

## 2020-10-24 ENCOUNTER — Other Ambulatory Visit: Payer: Self-pay

## 2020-10-24 ENCOUNTER — Ambulatory Visit (INDEPENDENT_AMBULATORY_CARE_PROVIDER_SITE_OTHER): Payer: 59

## 2020-10-24 VITALS — BP 120/88 | HR 86 | Ht 68.0 in | Wt 212.0 lb

## 2020-10-24 DIAGNOSIS — R0602 Shortness of breath: Secondary | ICD-10-CM

## 2020-10-24 DIAGNOSIS — R002 Palpitations: Secondary | ICD-10-CM | POA: Diagnosis not present

## 2020-10-24 DIAGNOSIS — R55 Syncope and collapse: Secondary | ICD-10-CM | POA: Diagnosis not present

## 2020-10-24 DIAGNOSIS — R42 Dizziness and giddiness: Secondary | ICD-10-CM | POA: Diagnosis not present

## 2020-10-24 NOTE — Patient Instructions (Signed)
Medication Instructions:  Your physician recommends that you continue on your current medications as directed. Please refer to the Current Medication list given to you today.  *If you need a refill on your cardiac medications before your next appointment, please call your pharmacy*  Lab Work: Your physician recommends that you return for lab work in: TODAY - CBC, CMP, TSH, HGB A1C.  If you have labs (blood work) drawn today and your tests are completely normal, you will receive your results only by: Marland Kitchen MyChart Message (if you have MyChart) OR . A paper copy in the mail If you have any lab test that is abnormal or we need to change your treatment, we will call you to review the results.  Testing/Procedures:  ZIO XT MONITOR 14 DAYS Your physician has recommended that you wear a Zio monitor. This monitor is a medical device that records the heart's electrical activity. Doctors most often use these monitors to diagnose arrhythmias. Arrhythmias are problems with the speed or rhythm of the heartbeat. The monitor is a small device applied to your chest. You can wear one while you do your normal daily activities. While wearing this monitor if you have any symptoms to push the button and record what you felt. Once you have worn this monitor for the period of time provider prescribed (Usually 14 days), you will return the monitor device in the postage paid box. Once it is returned they will download the data collected and provide [place] with a report which the provider will then review and we will call you with those results. Important tips:  1. Avoid showering during the first 24 hours of wearing the monitor. 2. Avoid excessive sweating to help maximize wear time. 3. Do not submerge the device, no hot tubs, and no swimming pools. 4. Keep any lotions or oils away from the patch. 5. After 24 hours you may shower with the patch on. Take brief showers with your back facing the shower head.  6. Do not remove  patch once it has been placed because that will interrupt data and decrease adhesive wear time. 7. Push the button when you have any symptoms and write down what you were feeling. 8. Once you have completed wearing your monitor, remove and place into box which has postage paid and place in your outgoing mailbox.  9. If for some reason you have misplaced your box then call our office and we can provide another box and/or mail it off for you.   Follow-Up: At [facility], you and your health needs are our priority.  As part of our continuing mission to provide you with exceptional heart care, we have created designated Provider Care Teams.  These Care Teams include your primary Cardiologist (physician) and Advanced Practice Providers (APPs -  Physician Assistants and Nurse Practitioners) who all work together to provide you with the care you need, when you need it.  We recommend signing up for the patient portal called "MyChart".  Sign up information is provided on this After Visit Summary.  MyChart is used to connect with patients for Virtual Visits (Telemedicine).  Patients are able to view lab/test results, encounter notes, upcoming appointments, etc.  Non-urgent messages can be sent to your provider as well.   To learn more about what you can do with MyChart, go to [email or web address].    Your next appointment:   4-6 week(s)  The format for your next appointment:   In Person  Provider:   You may see  DR Cristal Deer END or one of the following Advanced Practice Providers on your designated Care Team:    Nicolasa Ducking, NP  Eula Listen, PA-C  Marisue Ivan, PA-C  Cadence Essex, [place]  Gillian Shields, NP

## 2020-10-25 ENCOUNTER — Telehealth: Payer: Self-pay | Admitting: *Deleted

## 2020-10-25 LAB — HEMOGLOBIN A1C
Est. average glucose Bld gHb Est-mCnc: 100 mg/dL
Hgb A1c MFr Bld: 5.1 % (ref 4.8–5.6)

## 2020-10-25 LAB — CBC
Hematocrit: 47.9 % (ref 37.5–51.0)
Hemoglobin: 16 g/dL (ref 13.0–17.7)
MCH: 28.5 pg (ref 26.6–33.0)
MCHC: 33.4 g/dL (ref 31.5–35.7)
MCV: 85 fL (ref 79–97)
Platelets: 271 10*3/uL (ref 150–450)
RBC: 5.62 x10E6/uL (ref 4.14–5.80)
RDW: 12.8 % (ref 11.6–15.4)
WBC: 6.5 10*3/uL (ref 3.4–10.8)

## 2020-10-25 LAB — COMPREHENSIVE METABOLIC PANEL
ALT: 25 IU/L (ref 0–44)
AST: 21 IU/L (ref 0–40)
Albumin/Globulin Ratio: 1.4 (ref 1.2–2.2)
Albumin: 4.2 g/dL (ref 4.1–5.2)
Alkaline Phosphatase: 63 IU/L (ref 44–121)
BUN/Creatinine Ratio: 15 (ref 9–20)
BUN: 14 mg/dL (ref 6–20)
Bilirubin Total: 0.3 mg/dL (ref 0.0–1.2)
CO2: 24 mmol/L (ref 20–29)
Calcium: 9.4 mg/dL (ref 8.7–10.2)
Chloride: 104 mmol/L (ref 96–106)
Creatinine, Ser: 0.93 mg/dL (ref 0.76–1.27)
GFR calc Af Amer: 129 mL/min/{1.73_m2} (ref >59)
GFR calc non Af Amer: 111 mL/min/{1.73_m2} (ref >59)
Globulin, Total: 3.1 g/dL (ref 1.5–4.5)
Glucose: 100 mg/dL — ABNORMAL HIGH (ref 65–99)
Potassium: 4.2 mmol/L (ref 3.5–5.2)
Sodium: 139 mmol/L (ref 134–144)
Total Protein: 7.3 g/dL (ref 6.0–8.5)

## 2020-10-25 LAB — TSH: TSH: 1.5 u[IU]/mL (ref 0.450–4.500)

## 2020-10-25 NOTE — Telephone Encounter (Signed)
-----   Message from Yvonne Kendall, MD sent at 10/25/2020  2:43 PM EST ----- No significant abnormality noted to explain symptoms. Proceed with event monitor and echocardiogram, as discussed yesterday's visit.

## 2020-10-25 NOTE — Telephone Encounter (Signed)
No answer. Left message to call back.   

## 2020-10-25 NOTE — Telephone Encounter (Signed)
Patient called back and verbalized understanding.

## 2020-10-29 ENCOUNTER — Other Ambulatory Visit: Payer: Self-pay | Admitting: Pain Medicine

## 2020-10-29 DIAGNOSIS — R002 Palpitations: Secondary | ICD-10-CM

## 2020-10-29 DIAGNOSIS — R531 Weakness: Secondary | ICD-10-CM

## 2020-10-29 DIAGNOSIS — R42 Dizziness and giddiness: Secondary | ICD-10-CM

## 2020-10-29 DIAGNOSIS — E162 Hypoglycemia, unspecified: Secondary | ICD-10-CM

## 2020-10-29 DIAGNOSIS — R410 Disorientation, unspecified: Secondary | ICD-10-CM

## 2020-10-29 DIAGNOSIS — R55 Syncope and collapse: Secondary | ICD-10-CM

## 2020-10-29 DIAGNOSIS — R0602 Shortness of breath: Secondary | ICD-10-CM

## 2020-11-21 ENCOUNTER — Ambulatory Visit: Payer: 59 | Admitting: Family Medicine

## 2020-11-21 ENCOUNTER — Encounter: Payer: Self-pay | Admitting: Family Medicine

## 2020-11-21 ENCOUNTER — Other Ambulatory Visit: Payer: Self-pay | Admitting: Family Medicine

## 2020-11-21 ENCOUNTER — Other Ambulatory Visit: Payer: Self-pay

## 2020-11-21 VITALS — BP 108/74 | HR 72 | Temp 98.7°F | Resp 16 | Ht 68.0 in | Wt 216.0 lb

## 2020-11-21 DIAGNOSIS — J4520 Mild intermittent asthma, uncomplicated: Secondary | ICD-10-CM

## 2020-11-21 DIAGNOSIS — J3089 Other allergic rhinitis: Secondary | ICD-10-CM

## 2020-11-21 DIAGNOSIS — R55 Syncope and collapse: Secondary | ICD-10-CM | POA: Diagnosis not present

## 2020-11-21 DIAGNOSIS — R002 Palpitations: Secondary | ICD-10-CM

## 2020-11-21 DIAGNOSIS — K529 Noninfective gastroenteritis and colitis, unspecified: Secondary | ICD-10-CM

## 2020-11-21 DIAGNOSIS — R0602 Shortness of breath: Secondary | ICD-10-CM

## 2020-11-21 DIAGNOSIS — E162 Hypoglycemia, unspecified: Secondary | ICD-10-CM

## 2020-11-21 NOTE — Assessment & Plan Note (Signed)
Seems to be vasovagal in nature Discussed avoiding triggers and laying down if he feels dizzy/faint Followed by cardiology

## 2020-11-21 NOTE — Progress Notes (Signed)
New patient visit   Patient: Martin Durham   DOB: [birth year]   29 y.o. Male  MRN: [id number] Visit Date: 11/21/2020  Today's healthcare provider: Shirlee Latch, MD   Chief Complaint  Patient presents with  . New Patient (Initial Visit)   Subjective    Martin Durham is a 30 y.o. male who presents today as a new patient to establish care.  HPI  Patient here today C/O shortness of breath, palpitations, dizziness, confusion, and diarrhea on and off x 3 years. Patient reports symptoms are worsening in the last few months.  Presyncope: with exertion and position changes, x3y, now more frequent  Dizziness and SOB, headache, confusion, rapid heart rate: can occur with hunger, after eating, coffee, marijuana, wine. Vennie Homans, now more frequent  Palpitations: X3y, now more frequent  Also with intermittent symptoms of hypoglycemia - mostly when skipping a meal.  Saw cardiology on 10/24/20 and had normal EKG, normal labs, incl A1c, CBC, CMP, TSH.  Review of Echo from 2018 (done to eval heart murmur) showed possible PFO, but no significant abnormality on bubble study.  Diarrhea: loose stools without urgency or blood in stool At least once per day x1 yr. No recent dietary changes, except eating more meat recently. No milk, +Cheese. Occasional heartburn.  Asthma: Previously diagnosed with PFTs as mild. Takes albuterol prn.  Allergic rhinitis: Seeing ENT for chronic rhinitis. Taking Azelastin and Nasacort with fair relief.  Past Medical History:  Diagnosis Date  . Allergies   . Allergy   . Asthma   . Heart murmur   . Hypoglycemia    Past Surgical History:  Procedure Laterality Date  . WISDOM TOOTH EXTRACTION Bilateral 2012   Family Status  Relation Name Status  . Father  Alive  . PGF  Alive  . Mother  Alive  . Brother  Alive  . MGM  Alive  . MGF  Alive  . PGM  Alive  . Brother  Alive  . Neg Hx  (Not Specified)   Family History  Problem Relation Age of  Onset  . Diabetes Father   . Hyperlipidemia Paternal Grandfather   . Hypertension Paternal Grandfather   . Heart disease Neg Hx   . Breast cancer Neg Hx   . Prostate cancer Neg Hx    Social History   Socioeconomic History  . Marital status: Single    Spouse name: Not on file  . Number of children: 0  . Years of education: Not on file  . Highest education level: Not on file  Occupational History  . Occupation: unemployed  Tobacco Use  . Smoking status: Never Smoker  . Smokeless tobacco: Never Used  . Tobacco comment: smoked a small amount in college  Vaping Use  . Vaping Use: Never used  Substance and Sexual Activity  . Alcohol use: Not Currently    Comment: rare, social  . Drug use: Not Currently    Types: Marijuana    Comment: Last used 1-2 months ago  . Sexual activity: Yes    Partners: Female    Birth control/protection: Condom  Other Topics Concern  . Not on file  Social History Narrative  . Not on file   Social Determinants of Health   Financial Resource Strain: Not on file  Food Insecurity: Not on file  Transportation Needs: Not on file  Physical Activity: Not on file  Stress: Not on file  Social Connections: Not on file   Outpatient Medications  Prior to Visit  Medication Sig  . Azelastine HCl 137 MCG/SPRAY SOLN Place 1 spray into the nose daily.  . Triamcinolone Acetonide (NASACORT ALLERGY 24HR NA) Place into the nose.  . [DISCONTINUED] fluticasone (FLONASE) 50 MCG/ACT nasal spray Place 1 spray into the nose daily.  Marland Kitchen albuterol (VENTOLIN HFA) 108 (90 Base) MCG/ACT inhaler Inhale 1-2 puffs into the lungs every 4 (four) hours as needed for wheezing or shortness of breath. (Patient not taking: Reported on 11/21/2020)   No facility-administered medications prior to visit.   Allergies  Allergen Reactions  . Dust Mite Extract Other (See Comments)    Rhinitis    Immunization History  Administered Date(s) Administered  . PFIZER(Purple Top)SARS-COV-2  Vaccination 11/22/2019, 12/12/2019, 09/12/2020    Health Maintenance  Topic Date Due  . Hepatitis C Screening  Never done  . HIV Screening  Never done  . TETANUS/TDAP  01/07/2020  . INFLUENZA VACCINE  Completed  . COVID-19 Vaccine  Completed    Patient Care Team: Beryle Flock Marzella Schlein, MD as PCP - General (Family Medicine) Renelda Mom, MD as Consulting Physician (Allergy) End, Cristal Deer, MD as Consulting Physician (Cardiology) Drema Halon, MD as Consulting Physician (Otolaryngology)  Review of Systems  Constitutional: Positive for appetite change, fatigue and unexpected weight change.  HENT: Positive for congestion and sinus pressure.   Eyes: Negative.   Respiratory: Positive for shortness of breath.   Cardiovascular: Positive for chest pain and palpitations.  Gastrointestinal: Positive for diarrhea.  Endocrine: Positive for polyphagia.  Genitourinary: Negative.   Musculoskeletal: Negative.   Skin: Positive for rash.  Allergic/Immunologic: Negative.   Neurological: Positive for dizziness.  Hematological: Negative.   Psychiatric/Behavioral: Positive for confusion.    Last CBC Lab Results  Component Value Date   WBC 6.5 10/24/2020   HGB 16.0 10/24/2020   HCT 47.9 10/24/2020   MCV 85 10/24/2020   MCH 28.5 10/24/2020   RDW 12.8 10/24/2020   PLT 271 10/24/2020   Last metabolic panel Lab Results  Component Value Date   GLUCOSE 100 (H) 10/24/2020   NA 139 10/24/2020   K 4.2 10/24/2020   CL 104 10/24/2020   CO2 24 10/24/2020   BUN 14 10/24/2020   CREATININE 0.93 10/24/2020   GFRNONAA 111 10/24/2020   GFRAA 129 10/24/2020   CALCIUM 9.4 10/24/2020   PROT 7.3 10/24/2020   ALBUMIN 4.2 10/24/2020   LABGLOB 3.1 10/24/2020   AGRATIO 1.4 10/24/2020   BILITOT 0.3 10/24/2020   ALKPHOS 63 10/24/2020   AST 21 10/24/2020   ALT 25 10/24/2020   Last hemoglobin A1c Lab Results  Component Value Date   HGBA1C 5.1 10/24/2020   Last thyroid functions Lab  Results  Component Value Date   TSH 1.500 10/24/2020      Objective    BP 108/74 (BP Location: Left Arm, Patient Position: Sitting, Cuff Size: Large)   Pulse 72   Temp 98.7 F (37.1 C) (Oral)   Resp 16   Ht 5\' 8"  (1.727 m)   Wt 216 lb (98 kg)   SpO2 98%   BMI 32.84 kg/m    Physical Exam Vitals reviewed.  Constitutional:      General: He is not in acute distress.    Appearance: Normal appearance. He is not diaphoretic.  HENT:     Head: Normocephalic and atraumatic.     Right Ear: Tympanic membrane, ear canal and external ear normal.     Left Ear: Tympanic membrane, ear canal and external ear normal.  Eyes:     General: No scleral icterus.    Conjunctiva/sclera: Conjunctivae normal.  Cardiovascular:     Rate and Rhythm: Normal rate and regular rhythm.     Pulses: Normal pulses.     Heart sounds: Normal heart sounds. No murmur heard.   Pulmonary:     Effort: Pulmonary effort is normal. No respiratory distress.     Breath sounds: Normal breath sounds. No wheezing or rhonchi.  Abdominal:     General: There is no distension.     Palpations: Abdomen is soft.     Tenderness: There is no abdominal tenderness. There is no guarding or rebound.  Musculoskeletal:     Cervical back: Neck supple.     Right lower leg: No edema.     Left lower leg: No edema.  Lymphadenopathy:     Cervical: No cervical adenopathy.  Skin:    General: Skin is warm and dry.     Capillary Refill: Capillary refill takes less than 2 seconds.     Findings: No rash.  Neurological:     Mental Status: He is alert and oriented to person, place, and time. Mental status is at baseline.  Psychiatric:        Mood and Affect: Mood normal.        Behavior: Behavior normal.      Depression Screen PHQ 2/9 Scores 11/21/2020 09/23/2017  PHQ - 2 Score 1 0  PHQ- 9 Score 7 -   No results found for any visits on 11/21/20.  Assessment & Plan      Problem List Items Addressed This Visit      Respiratory    Asthma, chronic (Chronic)    Longstanding and mild Continue albuterol as needed      Chronic non-seasonal allergic rhinitis (Chronic)    Followed by ENT Continue azelastine and Flonase        Digestive   Chronic diarrhea - Primary    On going for 1 yr No red flags Benign exam today Encourage food diary to determine if there are food triggers Patient reports upcoming GI evaluation Celiac panel Nutrition referral to help with food choices      Relevant Orders   Amb ref to Medical Nutrition Therapy-MNT   Celiac Disease Panel     Endocrine   Hypoglycemia, unspecified     Reports intermittent episodes of symptomatic hypoglycemia His blood sugar log today shows normal blood sugars We discussed not going long periods of time without eating so he is not relying on his glycogen stores He has upcoming appointment with endocrinology Reviewed normal A1c from last month Not any medications or iatrogenic cause of hypoglycemia        Other   Shortness of breath    Related to his palpitations and chest pain Undergoing cardiac work-up at this time He does have a history of asthma and can continue albuterol as needed Benign lung exam today      Relevant Orders   Amb ref to Medical Nutrition Therapy-MNT   Palpitations    Being worked up by cardiology Nonsignificant cardiac monitor recently He is follow-up next month and there is consideration of stress test to provoke any arrhythmia that could be causing this      Relevant Orders   Amb ref to Medical Nutrition Therapy-MNT   Vasovagal near syncope    Seems to be vasovagal in nature Discussed avoiding triggers and laying down if he feels dizzy/faint Followed by cardiology  Relevant Orders   Amb ref to Medical Nutrition Therapy-MNT       Return in about 6 months (around 05/21/2021) for CPE.     Total time spent on today's visit was greater than 45 minutes, including both face-to-face time and nonface-to-face time  personally spent on review of chart (labs and imaging), discussing labs and goals, discussing further work-up, treatment options, referrals to specialist if needed, reviewing outside records of pertinent, answering patient's questions, and coordinating care.    I, Shirlee LatchAngela Kaylem Gidney, MD, have reviewed all documentation for this visit. The documentation on 11/21/20 for the exam, diagnosis, procedures, and orders are all accurate and complete.   Christi Wirick, Marzella SchleinAngela M, MD, MPH [facility]

## 2020-11-21 NOTE — Assessment & Plan Note (Signed)
Followed by ENT Continue azelastine and Flonase

## 2020-11-21 NOTE — Assessment & Plan Note (Signed)
On going for 1 yr No red flags Benign exam today Encourage food diary to determine if there are food triggers Patient reports upcoming GI evaluation Celiac panel Nutrition referral to help with food choices

## 2020-11-21 NOTE — Assessment & Plan Note (Signed)
Being worked up by cardiology Nonsignificant cardiac monitor recently He is follow-up next month and there is consideration of stress test to provoke any arrhythmia that could be causing this

## 2020-11-21 NOTE — Assessment & Plan Note (Signed)
Longstanding and mild Continue albuterol as needed

## 2020-11-21 NOTE — Assessment & Plan Note (Signed)
Related to his palpitations and chest pain Undergoing cardiac work-up at this time He does have a history of asthma and can continue albuterol as needed Benign lung exam today

## 2020-11-21 NOTE — Assessment & Plan Note (Signed)
Reports intermittent episodes of symptomatic hypoglycemia His blood sugar log today shows normal blood sugars We discussed not going long periods of time without eating so he is not relying on his glycogen stores He has upcoming appointment with endocrinology Reviewed normal A1c from last month Not any medications or iatrogenic cause of hypoglycemia

## 2020-11-23 LAB — CELIAC DISEASE PANEL
Endomysial IgA: NEGATIVE
IgA/Immunoglobulin A, Serum: 353 mg/dL (ref 90–386)
Transglutaminase IgA: <2 U/mL (ref 0–3)

## 2020-11-27 ENCOUNTER — Telehealth: Payer: Self-pay | Admitting: *Deleted

## 2020-11-27 NOTE — Telephone Encounter (Signed)
Patient returned call and notified: Negative for celiac disease

## 2020-12-06 ENCOUNTER — Ambulatory Visit (INDEPENDENT_AMBULATORY_CARE_PROVIDER_SITE_OTHER): Payer: 59 | Admitting: Medical

## 2020-12-06 ENCOUNTER — Encounter: Payer: Self-pay | Admitting: Physician Assistant

## 2020-12-06 ENCOUNTER — Other Ambulatory Visit: Payer: Self-pay

## 2020-12-06 VITALS — BP 124/90 | HR 75 | Ht 68.0 in | Wt 216.0 lb

## 2020-12-06 DIAGNOSIS — R002 Palpitations: Secondary | ICD-10-CM

## 2020-12-06 DIAGNOSIS — R42 Dizziness and giddiness: Secondary | ICD-10-CM | POA: Diagnosis not present

## 2020-12-06 NOTE — Progress Notes (Signed)
Cardiology Office Note:    Date:  12/06/2020   ID:  Martin Durham, DOB March 22, 1991, MRN [id number]  PCP:  Erasmo Downer, MD  [facility] Cardiologist:  Dr. [facility] Electrophysiologist:  None   Referring MD: Erasmo Downer, MD   Chief Complaint: Heart monitor follow-up  History of Present Illness:    Martin Durham is a 30 y.o. male with a hx of heart murmur as a child, asthma, environmental allergies, intermittent palpitations being seen for heart monitor follow-up.    Echo 2018 showed LVEF 55-60%, no WMA, no significant abnormality, although bubble study was not performed.  The patient was seen 10/24/2020 reporting progression of palpitation with near syncope, shortness of breath.  EKG showed LVH but otherwise no significant abnormalities.  Heart monitor showed sinus rhythm with average rate of 82 bpm, rare PACs and PVCs, no sustained arrhythmia or prolonged pause observed, patient triggered events corresponded to sinus rhythm and sinus tach. Labs were drawn, which were unremarkable.  Today, Heart monitor was reviewed with the patient. The patient reports he still having dizzy spells whenever he works out. This has been occurring for the last 3 years. It does seem to happen more when he's lifting weights over running and walking. Episodes occurring at the same frequency.  He feels shortness of breath, a little dizzy. No chest pain. He has some dizziness when he changes positions as well. Will check orthostatics. He has not passed out. He says he stays well-hydrated and eats regularly. Denies recent fever, chills, nausea, vomiting. He has an upcoming apt with endocrinology. EKG shows SR with no arrhythmia.   Past Medical History:  Diagnosis Date  . Allergies   . Allergy   . Asthma   . Heart murmur   . Hypoglycemia     Past Surgical History:  Procedure Laterality Date  . WISDOM TOOTH EXTRACTION Bilateral 2012    Current  Medications: Current Meds  Medication Sig  . albuterol (VENTOLIN HFA) 108 (90 Base) MCG/ACT inhaler Inhale 1-2 puffs into the lungs every 4 (four) hours as needed for wheezing or shortness of breath.  . Azelastine HCl 137 MCG/SPRAY SOLN Place 1 spray into the nose daily.  . Triamcinolone Acetonide (NASACORT ALLERGY 24HR NA) Place into the nose.     Allergies:   Dust mite extract   Social History   Socioeconomic History  . Marital status: Single    Spouse name: Not on file  . Number of children: 0  . Years of education: Not on file  . Highest education level: Not on file  Occupational History  . Occupation: unemployed  Tobacco Use  . Smoking status: Never Smoker  . Smokeless tobacco: Never Used  . Tobacco comment: smoked a small amount in college  Vaping Use  . Vaping Use: Never used  Substance and Sexual Activity  . Alcohol use: Not Currently    Comment: rare, social  . Drug use: Not Currently    Types: Marijuana    Comment: Last used 1-2 months ago  . Sexual activity: Yes    Partners: Female    Birth control/protection: Condom  Other Topics Concern  . Not on file  Social History Narrative  . Not on file   Social Determinants of Health   Financial Resource Strain: Not on file  Food Insecurity: Not on file  Transportation Needs: Not on file  Physical Activity: Not on file  Stress: Not on file  Social Connections: Not on  file     Family History: The patient's family history includes Diabetes in his father; Hyperlipidemia in his paternal grandfather; Hypertension in his paternal grandfather. There is no history of Heart disease, Breast cancer, or Prostate cancer.  ROS:   Please see the history of present illness.     All other systems reviewed and are negative.  EKGs/Labs/Other Studies Reviewed:    The following studies were reviewed today:  Heart monitor 11/24/2020   The patient was monitored for 14 day.  The predominant rhythm was sinus with an average  rate of 82 bpm (range 47-193 bpm).  There were rare PAC's and PVC's.  No sustained arrhythmia or prolonged pause was observed.  Patient triggered events mostly correspond to normal sinus rhythm and sinus tachycardia, though one triggered event contains an isolated PVC.   Predominantly sinus rhythm with rare PAC's and PVC's.  No significant arrhythmia noted.   Echo 09/2017 Study Conclusions   - Left ventricle: The cavity size was normal. Systolic function was  normal. The estimated ejection fraction was in the range of 55%  to 60%. Wall motion was normal; there were no regional wall  motion abnormalities. Left ventricular diastolic function  parameters were normal.  - Left atrium: The atrium was normal in size.  - Right ventricle: Systolic function was normal.  - Atrial septum: Poorly visualized though grossly no doppler signal  concerning for septal defect. Saline contrast bubble study not  performed.  - Pulmonary arteries: Systolic pressure was within the normal  range.   EKG:  EKG is  ordered today.  The ekg ordered today demonstrates SR, 75bpm, possible LVH, no acute changes  Recent Labs: 10/24/2020: ALT 25; BUN 14; Creatinine, Ser 0.93; Hemoglobin 16.0; Platelets 271; Potassium 4.2; Sodium 139; TSH 1.500  Recent Lipid Panel No results found for: CHOL, TRIG, HDL, CHOLHDL, VLDL, LDLCALC, LDLDIRECT   Physical Exam:    VS:  BP 124/90 (BP Location: Left Arm, Patient Position: Sitting, Cuff Size: Normal)   Pulse 75   Ht 5\' 8"  (1.727 m)   Wt 216 lb (98 kg)   SpO2 99%   BMI 32.84 kg/m     Wt Readings from Last 3 Encounters:  12/06/20 216 lb (98 kg)  11/21/20 216 lb (98 kg)  10/24/20 212 lb (96.2 kg)     GEN: Well nourished, well developed in no acute distress HEENT: Normal NECK: No JVD; No carotid bruits LYMPHATICS: No lymphadenopathy CARDIAC: RRR, no murmurs, rubs, gallops RESPIRATORY:  Clear to auscultation without rales, wheezing or rhonchi   ABDOMEN: Soft, non-tender, non-distended MUSCULOSKELETAL:  No edema; No deformity  SKIN: Warm and dry NEUROLOGIC:  Alert and oriented x 3 PSYCHIATRIC:  Normal affect   ASSESSMENT:    1. Palpitations   2. Dizziness    PLAN:    In order of problems listed above:  Palpitations, dizzy episodes Heart monitor showed predominately SR with rare PACs, PVCs, triggered event was ST. Triggered event was while he was working out and he felt his heart racing and some dizziness. Prior echo was normal. Symptoms are unchanged from last visit. EKG shows SR with no arrhythmia. No syncope. Hydration was encouraged as well as regular meals. He plans on seeing an endocrinologist. Could be that he is overexerting himself, and he is aware of this. Orthostatics vitals were unrevealing. Possibly vasovagal in nature. He is also following with PCP for further work-up. Can see back as needed.   Disposition: Follow up prn with MD/APP  Shared Decision Making/Informed Consent        Signed, Veera Stapleton Ardelle Lesches  12/06/2020 9:38 AM    [facility]

## 2020-12-06 NOTE — Patient Instructions (Signed)
Medication Instructions:   Your physician recommends that you continue on your current medications as directed. Please refer to the Current Medication list given to you today.  *If you need a refill on your cardiac medications before your next appointment, please call your pharmacy*   Lab Work:  None ordered  Testing/Procedures:  None ordered   Follow-Up: At [facility], you and your health needs are our priority.  As part of our continuing mission to provide you with exceptional heart care, we have created designated Provider Care Teams.  These Care Teams include your primary Cardiologist (physician) and Advanced Practice Providers (APPs -  Physician Assistants and Nurse Practitioners) who all work together to provide you with the care you need, when you need it.  We recommend signing up for the patient portal called "MyChart".  Sign up information is provided on this After Visit Summary.  MyChart is used to connect with patients for Virtual Visits (Telemedicine).  Patients are able to view lab/test results, encounter notes, upcoming appointments, etc.  Non-urgent messages can be sent to your provider as well.   To learn more about what you can do with MyChart, go to [email or web address].    Your next appointment:    Follow up as needed   

## 2021-01-09 ENCOUNTER — Ambulatory Visit: Payer: Self-pay | Admitting: Emergency Medicine

## 2021-02-05 ENCOUNTER — Other Ambulatory Visit: Payer: Self-pay | Admitting: Pain Medicine

## 2021-02-06 ENCOUNTER — Other Ambulatory Visit: Payer: Self-pay | Admitting: Pain Medicine

## 2021-02-06 DIAGNOSIS — J3089 Other allergic rhinitis: Secondary | ICD-10-CM

## 2021-02-06 DIAGNOSIS — J45990 Exercise induced bronchospasm: Secondary | ICD-10-CM

## 2021-02-06 DIAGNOSIS — J4540 Moderate persistent asthma, uncomplicated: Secondary | ICD-10-CM

## 2021-02-06 MED ORDER — AZELASTINE HCL 137 MCG/SPRAY NA SOLN: 1.0000 | Freq: Every day | NASAL | 99 refills | Status: AC

## 2021-02-06 MED ORDER — ALBUTEROL SULFATE HFA 108 (90 BASE) MCG/ACT IN AERS: 1.0000 | INHALATION_SPRAY | RESPIRATORY_TRACT | 99 refills | Status: AC | PRN

## 2021-02-06 MED ORDER — TRIAMCINOLONE ACETONIDE 55 MCG/ACT NA AERO: 1.0000 | INHALATION_SPRAY | Freq: Every day | NASAL | 11 refills | Status: AC

## 2021-04-29 ENCOUNTER — Other Ambulatory Visit: Payer: Self-pay

## 2021-05-22 ENCOUNTER — Encounter: Payer: Self-pay | Admitting: Family Medicine

## 2022-01-13 ENCOUNTER — Other Ambulatory Visit (HOSPITAL_COMMUNITY): Payer: Self-pay
